# Patient Record
Sex: Male | Born: 1995 | Race: White | Hispanic: No | Marital: Single | Smoking: Current every day smoker
Health system: Southern US, Community
[De-identification: ages and names within clinical notes are randomized; demographics above are authoritative.]

## PROBLEM LIST (undated history)

## (undated) DIAGNOSIS — Z202 Contact with and (suspected) exposure to infections with a predominantly sexual mode of transmission: Secondary | ICD-10-CM

## (undated) HISTORY — PX: ADENOIDECTOMY: SUR15

## (undated) HISTORY — PX: OTHER SURGICAL HISTORY: SHX169

## (undated) HISTORY — PX: APPENDECTOMY: SHX54

---
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
## 2014-05-01 ENCOUNTER — Encounter: Payer: Self-pay | Admitting: *Deleted

## 2014-05-01 ENCOUNTER — Emergency Department (INDEPENDENT_AMBULATORY_CARE_PROVIDER_SITE_OTHER)
Admission: EM | Admit: 2014-05-01 | Discharge: 2014-05-01 | Disposition: A | Discharge status: Home / Self Care | Payer: BLUE CROSS/BLUE SHIELD | Source: Home / Self Care | Attending: Family Medicine | Admitting: Family Medicine

## 2014-05-01 DIAGNOSIS — Z202 Contact with and (suspected) exposure to infections with a predominantly sexual mode of transmission: Secondary | ICD-10-CM | POA: Diagnosis not present

## 2014-05-01 DIAGNOSIS — R112 Nausea with vomiting, unspecified: Secondary | ICD-10-CM

## 2014-05-01 NOTE — ED Provider Notes (Signed)
CSN: [id number]     Arrival date & time 05/01/14  1454 History   First MD Initiated Contact with Patient 05/01/14 1537     Chief Complaint  Patient presents with  . std testing   . Emesis      HPI Comments: Patient presents with two complaints: 1)  Yesterday morning at 7am while at work he developed nausea/vomiting without diarrhea.  No abdominal pain, and no fevers, chills, and sweats.  He states that he had had mild URI symptoms for about a week prior.  His symptoms have now resolved and he feels better. 2)  He states that he would like STD screening.  He is totally assymptomatic, but a recent sexual partner may have HSV.  Patient is a 19 y.o. male presenting with vomiting. The history is provided by the patient.  Emesis Severity:  Mild Duration:  1 day Timing:  Intermittent Quality:  Stomach contents Able to tolerate:  Liquids and solids Progression:  Resolved Chronicity:  New Recent urination:  Normal Relieved by:  Nothing Worsened by:  Nothing tried Ineffective treatments:  None tried Associated symptoms: cough and URI   Associated symptoms: no abdominal pain, no arthralgias, no chills, no diarrhea, no fever, no headaches, no myalgias and no sore throat     History reviewed. No pertinent past medical history. Past Surgical History  Procedure Laterality Date  . Appendectomy    . Adenoidectomy     Family History  Problem Relation Age of Onset  . Thyroid disease Mother   . Cancer Mother     kidney  . Thalassemia Mother   . Thalassemia Sister    History  Substance Use Topics  . Smoking status: Current Every Day Smoker  . Smokeless tobacco: Never Used  . Alcohol Use: Yes    Review of Systems  Constitutional: Negative for chills.  HENT: Negative for sore throat.   Gastrointestinal: Positive for vomiting. Negative for abdominal pain and diarrhea.  Musculoskeletal: Negative for myalgias and arthralgias.  Neurological: Negative for headaches.  All other systems  reviewed and are negative.  Nursing notes and Vital Signs reviewed. Appearance:  Patient appears stated age, and in no acute distress Eyes:  Pupils are equal, round, and reactive to light and accomodation.  Extraocular movement is intact.  Conjunctivae are not inflamed  Ears:  Canals normal.  Tympanic membranes normal.  Nose:  Normal turbinates.  No sinus tenderness.     Pharynx:  Normal; moist mucous membranes  Neck:  Supple.  No adenopathy  Lungs:  Clear to auscultation.  Breath sounds are equal.  Heart:  Regular rate and rhythm without murmurs, rubs, or gallops.  Abdomen:  Nontender without masses or hepatosplenomegaly.  Bowel sounds are present.  No CVA or flank tenderness.  Extremities:  No edema.  No calf tenderness Skin:  No rash present.   Allergies  Review of patient's allergies indicates no known allergies.  Home Medications   Prior to Admission medications   Not on File   BP 126/74 mmHg  Pulse 54  Temp(Src) 97.8 F (36.6 C) (Oral)  Resp 14  Ht  (1.854 m)  Wt 151 lb (68.493 kg)  BMI 19.93 kg/m2  SpO2 99% Physical Exam  ED Course  Procedures  None    Labs Reviewed  GC/CHLAMYDIA PROBE AMP, URINE  HSV(HERPES SIMPLEX VRS) I + II AB-IGG  HSV(HERPES SIMPLEX VRS) I + II AB-IGM  RPR  HIV ANTIBODY (ROUTINE TESTING)      MDM  1. Possible exposure to STD   2. Nausea and vomiting, vomiting of unspecified type; suspect resolving viral gastroenteritis      Advance diet to a BRAT diet (Bananas, Rice, Applesauce, Toast) when nausea and vomiting resolved.  Then gradually advance to a regular diet as tolerated.  May continue Phenergan as needed for nausea. GC/chlamydia, HSV antibodies, RPR, HIV antibodies pending   Lattie Haw, MD 05/07/14 [phone]

## 2014-05-01 NOTE — Discharge Instructions (Signed)
Advance diet to a BRAT diet (Bananas, Rice, Applesauce, Toast) when nausea and vomiting resolved.  Then gradually advance to a regular diet as tolerated.  May continue Phenergan as needed for nausea.

## 2014-05-01 NOTE — ED Notes (Addendum)
Daniel Boyd c/o vomiting x yesterday and this AM.Denies abdominal pain or fever. He would also like to be screened for STDs. He is asymptomatic

## 2014-05-02 LAB — HIV ANTIBODY (ROUTINE TESTING W REFLEX): HIV 1&2 Ab, 4th Generation: NONREACTIVE

## 2014-05-02 LAB — RPR

## 2014-05-03 LAB — HSV(HERPES SIMPLEX VRS) I + II AB-IGG
HSV 1 Glycoprotein G Ab, IgG: <0.1 IV
HSV 2 Glycoprotein G Ab, IgG: <0.1 IV

## 2014-05-03 LAB — HSV(HERPES SIMPLEX VRS) I + II AB-IGM: Herpes Simplex Vrs I&II-IgM Ab (EIA): 0.55 INDEX

## 2014-05-05 ENCOUNTER — Telehealth: Payer: Self-pay | Admitting: *Deleted

## 2014-05-05 LAB — GC/CHLAMYDIA PROBE AMP, URINE
Chlamydia, Swab/Urine, PCR: NEGATIVE
GC PROBE AMP, URINE: NEGATIVE

## 2014-06-20 ENCOUNTER — Emergency Department (INDEPENDENT_AMBULATORY_CARE_PROVIDER_SITE_OTHER)
Admission: EM | Admit: 2014-06-20 | Discharge: 2014-06-20 | Disposition: A | Discharge status: Home / Self Care | Payer: BLUE CROSS/BLUE SHIELD | Source: Home / Self Care | Attending: Emergency Medicine | Admitting: Emergency Medicine

## 2014-06-20 ENCOUNTER — Encounter: Payer: Self-pay | Admitting: Emergency Medicine

## 2014-06-20 DIAGNOSIS — B86 Scabies: Secondary | ICD-10-CM

## 2014-06-20 MED ORDER — PERMETHRIN 5 % EX CREA: TOPICAL_CREAM | CUTANEOUS | Status: DC

## 2014-06-20 NOTE — ED Notes (Signed)
Pt c/o itchy rash on entire bosy x3 weeks. States it continues to spread and he noticed on his penis yesterday. Denies pain or leakage.

## 2014-06-20 NOTE — Discharge Instructions (Signed)

## 2014-06-21 NOTE — ED Provider Notes (Signed)
CSN: [id number]     Arrival date & time 06/20/14  1731 History   First MD Initiated Contact with Patient 06/20/14 1811     Chief Complaint  Patient presents with  . Rash     (Consider location/radiation/quality/duration/timing/severity/associated sxs/prior Treatment) Patient is a 19 y.o. male presenting with rash. The history is provided by the patient. No language interpreter was used.  Rash Location:  Full body Quality: itchiness   Severity:  Moderate Onset quality:  Gradual Duration:  3 weeks Timing:  Constant Progression:  Worsening Chronicity:  New Context: not exposure to similar rash   Relieved by:  Nothing Worsened by:  Nothing tried Ineffective treatments:  None tried Pt reports he has a rash full body,  Rash started after wearing gloves at work (someone elses)  History reviewed. No pertinent past medical history. Past Surgical History  Procedure Laterality Date  . Appendectomy    . Adenoidectomy     Family History  Problem Relation Age of Onset  . Thyroid disease Mother   . Cancer Mother     kidney  . Thalassemia Mother   . Thalassemia Sister    History  Substance Use Topics  . Smoking status: Current Every Day Smoker  . Smokeless tobacco: Never Used  . Alcohol Use: Yes    Review of Systems  Skin: Positive for rash.  All other systems reviewed and are negative.     Allergies  Review of patient's allergies indicates no known allergies.  Home Medications   Prior to Admission medications   Medication Sig Start Date End Date Taking? Authorizing Provider  permethrin (ELIMITE) 5 % cream Apply to affected area once 06/20/14   Lonia SkinnerLeslie K Krysteena Stalker, PA-C   BP 120/77 mmHg  Pulse 80  Temp(Src) 98.2 F (36.8 C) (Oral)  SpO2 97% Physical Exam  Constitutional: He is oriented to person, place, and time. He appears well-developed and well-nourished.  HENT:  Head: Normocephalic.  Eyes: EOM are normal.  Neck: Normal range of motion.  Pulmonary/Chest: Effort  normal.  Abdominal: He exhibits no distension.  Musculoskeletal: Normal range of motion.  Neurological: He is alert and oriented to person, place, and time.  Skin: There is erythema.  Burrows between fingers,  Arms chest abdomen adn genital area  Psychiatric: He has a normal mood and affect.  Nursing note and vitals reviewed.   ED Course  Procedures (including critical care time) Labs Review Labs Reviewed - No data to display  Imaging Review No results found.   EKG Interpretation None      MDM   Final diagnoses:  Scabies    Elemite Information of scabies treatment AVS    Elson AreasLeslie K Naz Denunzio, PA-C 06/21/14 1414

## 2014-09-30 ENCOUNTER — Emergency Department (INDEPENDENT_AMBULATORY_CARE_PROVIDER_SITE_OTHER)
Admission: EM | Admit: 2014-09-30 | Discharge: 2014-09-30 | Disposition: A | Discharge status: Home / Self Care | Payer: BLUE CROSS/BLUE SHIELD | Source: Home / Self Care | Attending: Emergency Medicine | Admitting: Emergency Medicine

## 2014-09-30 ENCOUNTER — Encounter: Payer: Self-pay | Admitting: *Deleted

## 2014-09-30 DIAGNOSIS — J020 Streptococcal pharyngitis: Secondary | ICD-10-CM

## 2014-09-30 LAB — POCT RAPID STREP A (OFFICE): RAPID STREP A SCREEN: NEGATIVE

## 2014-09-30 NOTE — ED Notes (Signed)
Sore throat, runny nose and congestion x 4 days. Afebrile.

## 2014-09-30 NOTE — ED Provider Notes (Signed)
CSN: [id number]     Arrival date & time 09/30/14  1642 History   First MD Initiated Contact with Patient 09/30/14 1708     Chief Complaint  Patient presents with  . Sore Throat   (Consider location/radiation/quality/duration/timing/severity/associated sxs/prior Treatment) HPI Casimiro NeedleMichael is a 19 y.o. male who complains of onset of cold symptoms for 4 days.  The symptoms are constant and mild-moderate in severity. Sometimes gets a sore throat because he occasionally sleeps with his mouth open. + sore throat No cough No pleuritic pain No wheezing No nasal congestion No post-nasal drainage No sinus pain/pressure No chest congestion No itchy/red eyes No earache No hemoptysis No SOB No chills/sweats No fever No nausea No vomiting No abdominal pain No diarrhea No skin rashes No fatigue No myalgias No headache     History reviewed. No pertinent past medical history. Past Surgical History  Procedure Laterality Date  . Appendectomy    . Adenoidectomy     Family History  Problem Relation Age of Onset  . Thyroid disease Mother   . Cancer Mother     kidney  . Thalassemia Mother   . Thalassemia Sister    History  Substance Use Topics  . Smoking status: Current Every Day Smoker  . Smokeless tobacco: Never Used  . Alcohol Use: Yes    Review of Systems  All other systems reviewed and are negative.   Allergies  Review of patient's allergies indicates no known allergies.  Home Medications   Prior to Admission medications   Medication Sig Start Date End Date Taking? Authorizing Provider  permethrin (ELIMITE) 5 % cream Apply to affected area once 06/20/14   Lonia SkinnerLeslie K Sofia, PA-C   BP 128/65 mmHg  Pulse 55  Temp(Src) 98.3 F (36.8 C) (Oral)  Resp 16  Wt 154 lb (69.854 kg)  SpO2 99% Physical Exam  Constitutional: He is oriented to person, place, and time. Vital signs are normal. He appears well-developed and well-nourished.  Non-toxic appearance. He does not appear ill.   HENT:  Head: Normocephalic and atraumatic.  Right Ear: Tympanic membrane, external ear and ear canal normal.  Left Ear: Tympanic membrane, external ear and ear canal normal.  Nose: Mucosal edema and rhinorrhea present.  Mouth/Throat: Posterior oropharyngeal erythema present. No oropharyngeal exudate or posterior oropharyngeal edema.  Eyes: No scleral icterus.  Neck: Neck supple.  Cardiovascular: Normal rate, regular rhythm and normal heart sounds.   Pulmonary/Chest: Effort normal and breath sounds normal. No respiratory distress.  Neurological: He is alert and oriented to person, place, and time.  Skin: Skin is warm and dry.  Psychiatric: He has a normal mood and affect. His speech is normal.  Nursing note and vitals reviewed.   ED Course  Procedures (including critical care time) Labs Review Labs Reviewed - No data to display  Imaging Review No results found.   MDM   1. Streptococcal sore throat    1)  Rapid strep neg, culture pending.  Will call patient with results.  No antibiotics given today. 2)  Use nasal saline solution (over the counter) at least 3 times a day. 3)  Use over the counter decongestants like Zyrtec-D every 12 hours as needed to help with congestion.  If you have hypertension, do not take medicines with sudafed.  4)  Can take tylenol every 6 hours or motrin every 8 hours for pain or fever. 5)  Follow up with your primary doctor if no improvement in 5-7 days, sooner if increasing pain, fever,  or new symptoms.     Marlaine Hind, MD 09/30/14 [phone]

## 2014-10-01 LAB — STREP A DNA PROBE: GASP: NEGATIVE

## 2014-10-02 ENCOUNTER — Telehealth: Payer: Self-pay | Admitting: *Deleted

## 2015-01-02 ENCOUNTER — Encounter: Payer: Self-pay | Admitting: Emergency Medicine

## 2015-01-02 ENCOUNTER — Emergency Department
Admission: EM | Admit: 2015-01-02 | Discharge: 2015-01-02 | Disposition: A | Discharge status: Home / Self Care | Payer: PRIVATE HEALTH INSURANCE | Source: Home / Self Care | Attending: Family Medicine | Admitting: Family Medicine

## 2015-01-02 DIAGNOSIS — R300 Dysuria: Secondary | ICD-10-CM | POA: Diagnosis not present

## 2015-01-02 DIAGNOSIS — R369 Urethral discharge, unspecified: Secondary | ICD-10-CM

## 2015-01-02 DIAGNOSIS — Z8619 Personal history of other infectious and parasitic diseases: Secondary | ICD-10-CM

## 2015-01-02 DIAGNOSIS — Z711 Person with feared health complaint in whom no diagnosis is made: Secondary | ICD-10-CM | POA: Diagnosis not present

## 2015-01-02 HISTORY — DX: Contact with and (suspected) exposure to infections with a predominantly sexual mode of transmission: Z20.2

## 2015-01-02 LAB — POCT URINALYSIS DIP (MANUAL ENTRY)
Bilirubin, UA: NEGATIVE
Blood, UA: NEGATIVE
Glucose, UA: NEGATIVE
Ketones, POC UA: NEGATIVE
Leukocytes, UA: NEGATIVE
Nitrite, UA: NEGATIVE
Protein Ur, POC: 30 — AB
Spec Grav, UA: 1.02 (ref 1.005–1.03)
Urobilinogen, UA: 4 (ref 0–1)
pH, UA: 7 (ref 5–8)

## 2015-01-02 MED ORDER — AZITHROMYCIN 250 MG PO TABS: 1000.0000 mg | ORAL_TABLET | Freq: Every day | ORAL | Status: DC

## 2015-01-02 MED ORDER — CEFTRIAXONE SODIUM 250 MG IJ SOLR
250.0000 mg | Freq: Once | INTRAMUSCULAR | Status: AC
  Administered 2015-01-02: 250 mg via INTRAMUSCULAR

## 2015-01-02 NOTE — Discharge Instructions (Signed)
Refrain from sexual intercourse for 7 days. Be sure to have all partners tested and treated for STDs.  This may be performed by your primary care provider or the health department.  Practice safe sex by always using condoms.   Please take antibiotics as prescribed and be sure to complete entire course even if you start to feel better to ensure infection does not come back.

## 2015-01-02 NOTE — ED Notes (Signed)
Reports 3-4 days of dysuria and some penile discharge. Has history of chlamydia and feels this is similar symptomology.

## 2015-01-02 NOTE — ED Provider Notes (Signed)
CSN: [id number]     Arrival date & time 01/02/15  1138 History   First MD Initiated Contact with Patient 01/02/15 1139     Chief Complaint  Patient presents with  . Dysuria  . Penile Discharge   (Consider location/radiation/quality/duration/timing/severity/associated sxs/prior Treatment) HPI Pt is a 19yo male presenting to [facility] with concern for STD. Pt c/o 3-4 day hx of dysuria and small amount of white penile discharge. Pt states it feels similar to symptoms he had when he had chlamydia when he was 19yo. Denies known exposure but does report having a new sexual partner and having unprotected intercourse.  Denies fever, chills, n/v/d. Denies abdominal or back pain. Denies rashes or lesions. No hx of kidney stones.  He was tested for HIV and syphilis in Jan 2016, tested negative. He does not want to be tested for these again at this time.  Past Medical History  Diagnosis Date  . Chlamydia contact, treated    Past Surgical History  Procedure Laterality Date  . Appendectomy    . Adenoidectomy    . Adnoidectomy     Family History  Problem Relation Age of Onset  . Thyroid disease Mother   . Cancer Mother     kidney  . Thalassemia Mother   . Thalassemia Sister    Social History  Substance Use Topics  . Smoking status: Current Every Day Smoker -- 1.00 packs/day  . Smokeless tobacco: Never Used  . Alcohol Use: Yes    Review of Systems  Constitutional: Negative for fever and chills.  Gastrointestinal: Negative for nausea, vomiting, abdominal pain and diarrhea.  Genitourinary: Positive for dysuria, urgency, frequency, decreased urine volume and discharge. Negative for hematuria, flank pain, penile swelling, scrotal swelling, genital sores, penile pain and testicular pain.  Musculoskeletal: Negative for myalgias and back pain.  Skin: Negative for rash.    Allergies  Review of patient's allergies indicates no known allergies.  Home Medications   Prior to Admission medications    Medication Sig Start Date End Date Taking? Authorizing Provider  azithromycin (ZITHROMAX) 250 MG tablet Take 4 tablets (1,000 mg total) by mouth daily. Take all 4 tabs at same time for complete treatment 01/02/15   Junius Finner, PA-C  permethrin (ELIMITE) 5 % cream Apply to affected area once 06/20/14   Elson Areas, PA-C   Meds Ordered and Administered this Visit   Medications  cefTRIAXone (ROCEPHIN) injection 250 mg (250 mg Intramuscular Given 01/02/15 1215)    BP 105/65 mmHg  Pulse 70  Temp(Src) 98 F (36.7 C) (Oral)  Resp 16  Ht 6\' 1"  (1.854 m)  Wt 144 lb (65.318 kg)  BMI 19.00 kg/m2  SpO2 98% No data found.   Physical Exam  Constitutional: He is oriented to person, place, and time. He appears well-developed and well-nourished.  HENT:  Head: Normocephalic and atraumatic.  Eyes: EOM are normal.  Neck: Normal range of motion.  Cardiovascular: Normal rate, regular rhythm and normal heart sounds.   Pulmonary/Chest: Effort normal. No respiratory distress. He has no wheezes.  Abdominal: Soft. There is no tenderness.  Musculoskeletal: Normal range of motion.  Neurological: He is alert and oriented to person, place, and time.  Skin: Skin is warm and dry. No rash noted.  Psychiatric: He has a normal mood and affect. His behavior is normal.  Nursing note and vitals reviewed.   ED Course  Procedures (including critical care time)  Labs Review Labs Reviewed  POCT URINALYSIS DIP (MANUAL ENTRY) - Abnormal;  Notable for the following:    Color, UA light yellow (*)    Clarity, UA cloudy (*)    Protein Ur, POC =30 (*)    All other components within normal limits  GC/CHLAMYDIA PROBE AMP, URINE  POCT UA - MICROSCOPIC ONLY    Imaging Review No results found.   MDM   1. Penile discharge   2. Dysuria   3. Concern about STD in male without diagnosis   4. History of chlamydia     Pt c/o penile discharge and dysuria.  Pt concerned for chlamydia. No systemic symptoms. Tx:  rocephin  IM Rx: azithromycin 1g  Advised pt to refrain from sexual intercourse for 7 days. Be sure to have all partners tested and treated for STDs.  This may be performed by your primary care provider or the health department.  Practice safe sex by always using condoms.  Patient verbalized understanding and agreement with treatment plan.      Junius Finner, PA-C 01/02/15 1243

## 2015-01-05 ENCOUNTER — Telehealth: Payer: Self-pay | Admitting: *Deleted

## 2015-01-05 LAB — GC/CHLAMYDIA PROBE AMP, URINE
Chlamydia, Swab/Urine, PCR: POSITIVE — AB
GC Probe Amp, Urine: NEGATIVE

## 2015-03-29 ENCOUNTER — Encounter: Payer: Self-pay | Admitting: *Deleted

## 2015-03-29 ENCOUNTER — Emergency Department
Admission: EM | Admit: 2015-03-29 | Discharge: 2015-03-29 | Disposition: A | Discharge status: Home / Self Care | Payer: BLUE CROSS/BLUE SHIELD | Source: Home / Self Care | Attending: Family Medicine | Admitting: Family Medicine

## 2015-03-29 DIAGNOSIS — R6889 Other general symptoms and signs: Secondary | ICD-10-CM | POA: Diagnosis not present

## 2015-03-29 LAB — POCT INFLUENZA A/B
Influenza A, POC: NEGATIVE
Influenza B, POC: NEGATIVE

## 2015-03-29 MED ORDER — DM-GUAIFENESIN ER 30-600 MG PO TB12: 1.0000 | ORAL_TABLET | Freq: Two times a day (BID) | ORAL | Status: DC

## 2015-03-29 MED ORDER — BENZONATATE 100 MG PO CAPS: 100.0000 mg | ORAL_CAPSULE | Freq: Three times a day (TID) | ORAL | Status: DC

## 2015-03-29 MED ORDER — SALINE SPRAY 0.65 % NA SOLN: 1.0000 | NASAL | Status: DC | PRN

## 2015-03-29 NOTE — ED Notes (Signed)
Pt c/o HA, nasal congestion, and cough x 1200 today. Denies sore throat or body aches. Reports taking Advil at 1030. Reports temp at work was 99.9 at 1225.

## 2015-03-29 NOTE — Discharge Instructions (Signed)

## 2015-03-29 NOTE — ED Provider Notes (Signed)
CSN: [id number]     Arrival date & time 03/29/15  1428 History   First MD Initiated Contact with Patient 03/29/15 1445     Chief Complaint  Patient presents with  . Headache  . Cough   (Consider location/radiation/quality/duration/timing/severity/associated sxs/prior Treatment) HPI  Pt is a 19yo male presenting to [facility] with c/o sudden onset flu-like symptoms that started around 12PM.  Pt c/o cough, congestion, body aches, fever, and chills.  Temp was 99.9 at work when pt was evaluated by the nurse.  Pt did take 1 Advil earlier this morning for a mild generalized headache. Pt states now the body aches and congestion are most worrisome for him.    Nurse at work advised him she sent another worker home yesterday for a fever. No sick contacts at home. No recent travel. He has not received flu vaccine.   Past Medical History  Diagnosis Date  . Chlamydia contact, treated    Past Surgical History  Procedure Laterality Date  . Appendectomy    . Adenoidectomy    . Adnoidectomy     Family History  Problem Relation Age of Onset  . Thyroid disease Mother   . Cancer Mother     kidney  . Thalassemia Mother   . Thalassemia Sister    Social History  Substance Use Topics  . Smoking status: Current Every Day Smoker -- 0.50 packs/day    Types: Cigarettes  . Smokeless tobacco: Never Used  . Alcohol Use: Yes    Review of Systems  Constitutional: Positive for fever and chills.  HENT: Positive for congestion, rhinorrhea, sinus pressure, sneezing and sore throat. Negative for trouble swallowing and voice change.   Respiratory: Positive for cough. Negative for shortness of breath.   Gastrointestinal: Negative for nausea, vomiting, abdominal pain and diarrhea.  Musculoskeletal: Positive for myalgias and arthralgias.  Neurological: Positive for headaches. Negative for dizziness and light-headedness.    Allergies  Review of patient's allergies indicates no known allergies.  Home Medications    Prior to Admission medications   Medication Sig Start Date End Date Taking? Authorizing Provider  benzonatate (TESSALON) 100 MG capsule Take 1 capsule (100 mg total) by mouth every 8 (eight) hours. 03/29/15   Junius Finner, PA-C  dextromethorphan-guaiFENesin (MUCINEX DM) 30-600 MG 12hr tablet Take 1 tablet by mouth 2 (two) times daily. 03/29/15   Junius Finner, PA-C  sodium chloride (OCEAN) 0.65 % SOLN nasal spray Place 1 spray into both nostrils as needed. 03/29/15   Junius Finner, PA-C   Meds Ordered and Administered this Visit  Medications - No data to display  BP 143/86 mmHg  Pulse 78  Temp(Src) 98.1 F (36.7 C) (Oral)  Resp 16  Ht  (1.854 m)  Wt 149 lb (67.586 kg)  BMI 19.66 kg/m2  SpO2 99% No data found.   Physical Exam  Constitutional: He appears well-developed and well-nourished.  HENT:  Head: Normocephalic and atraumatic.  Right Ear: Hearing, tympanic membrane, external ear and ear canal normal.  Left Ear: Hearing, tympanic membrane, external ear and ear canal normal.  Nose: Mucosal edema present.  Mouth/Throat: Uvula is midline and mucous membranes are normal. Posterior oropharyngeal erythema present. No oropharyngeal exudate, posterior oropharyngeal edema or tonsillar abscesses.  Eyes: Conjunctivae are normal. No scleral icterus.  Neck: Normal range of motion. Neck supple.  Cardiovascular: Normal rate, regular rhythm and normal heart sounds.   Pulmonary/Chest: Effort normal and breath sounds normal. No respiratory distress. He has no wheezes. He has no rales.  He exhibits no tenderness.  Abdominal: Soft. He exhibits no distension and no mass. There is no tenderness. There is no rebound and no guarding.  Musculoskeletal: Normal range of motion.  Neurological: He is alert.  Skin: Skin is warm and dry.  Nursing note and vitals reviewed.   ED Course  Procedures (including critical care time)  Labs Review Labs Reviewed  POCT INFLUENZA A/B    Imaging  Review No results found.   MDM   1. Flu-like symptoms    [facility] flu-like symptoms that started suddenly while at work this afternoon.   Rapid flu: negative  Symptoms likely viral in nature Rx: tessalon, mucinex DM, and saline nasal spray Encouraged fluids and rest.  F/u with PCP in 7-10 days if not improving, sooner if worsening. Patient verbalized understanding and agreement with treatment plan.     Junius Finnerrin O'Malley, PA-C 03/29/15 [phone]

## 2015-05-13 ENCOUNTER — Encounter: Payer: Self-pay | Admitting: *Deleted

## 2015-05-13 ENCOUNTER — Other Ambulatory Visit: Payer: Self-pay | Admitting: Emergency Medicine

## 2015-05-13 ENCOUNTER — Emergency Department
Admission: EM | Admit: 2015-05-13 | Discharge: 2015-05-13 | Disposition: A | Discharge status: Home / Self Care | Payer: BLUE CROSS/BLUE SHIELD | Source: Home / Self Care | Attending: Family Medicine | Admitting: Family Medicine

## 2015-05-13 DIAGNOSIS — Z202 Contact with and (suspected) exposure to infections with a predominantly sexual mode of transmission: Secondary | ICD-10-CM

## 2015-05-13 DIAGNOSIS — K029 Dental caries, unspecified: Secondary | ICD-10-CM | POA: Diagnosis not present

## 2015-05-13 DIAGNOSIS — K0889 Other specified disorders of teeth and supporting structures: Secondary | ICD-10-CM

## 2015-05-13 DIAGNOSIS — R369 Urethral discharge, unspecified: Secondary | ICD-10-CM

## 2015-05-13 MED ORDER — CEFTRIAXONE SODIUM 250 MG IJ SOLR
250.0000 mg | Freq: Once | INTRAMUSCULAR | Status: AC
  Administered 2015-05-13: 250 mg via INTRAMUSCULAR

## 2015-05-13 MED ORDER — DOXYCYCLINE HYCLATE 100 MG PO CAPS: 100.0000 mg | ORAL_CAPSULE | Freq: Two times a day (BID) | ORAL | Status: AC

## 2015-05-13 MED ORDER — MAGIC MOUTHWASH W/LIDOCAINE: 10.0000 mL | Freq: Three times a day (TID) | ORAL | Status: AC | PRN

## 2015-05-13 NOTE — Discharge Instructions (Signed)
You may take acetaminophen and ibuprofen for fever and pain.  Please take antibiotics as prescribed and be sure to complete entire course even if you start to feel better to ensure infection does not come back.  Be sure to call your dentist to schedule a follow up appointment for continued treatment of your teeth infections as you will likely need your wisdom teeth pulled.

## 2015-05-13 NOTE — ED Notes (Signed)
Pt c/o bilateral tooth pain on the bottom x 2 wks with HA. He reports taking Hydrocodone for pain. He request an rx for magic mouthwash. He also reports a hx of Chlamydia.he may have had some penile d/c today or "it could have been semen". Request testing.

## 2015-05-13 NOTE — ED Provider Notes (Signed)
CSN: [id number]     Arrival date & time 05/13/15  1907 History   First MD Initiated Contact with Patient 05/13/15 1916     Chief Complaint  Patient presents with  . Exposure to STD  . Dental Pain   (Consider location/radiation/quality/duration/timing/severity/associated sxs/prior Treatment) HPI Pt is a 20yo male [facility] with 2 separate complaints. Pt states he is initially here due to dental pain from his upper wisdom teeth that are infected.  Pt states he failed a drug screen for a "community group" he is with as he has been taking hydrocodone for pain but states it was not prescribed to him. He was advised to take care of his pain "the appropriate way" and is requesting magic mouthwash.  Pain is aching and throbbing, waxes and wanes, worse with chewing.  Pain is moderate to severe at times. He knows he needs to get them taken out but has not gotten around to it yet and states his dentist was closed today.  Denies fever, chills, n/v/d.  Pt also c/o symptoms concerning for chlamydia. Pt reports hx of several episodes of chlamydia.  Last episode was 2-3 months ago. Symptoms did resolve after treatment but for 2-3 days he has been having penile discharge. Pt is unsure if it is an STD or if it was just semen. He does report hx of unprotected intercourse. Denies abdominal pain, back pain, fever, chills, n/v/d.   Past Medical History  Diagnosis Date  . Chlamydia contact, treated    Past Surgical History  Procedure Laterality Date  . Appendectomy    . Adenoidectomy    . Adnoidectomy     Family History  Problem Relation Age of Onset  . Thyroid disease Mother   . Cancer Mother     kidney  . Thalassemia Mother   . Thalassemia Sister    Social History  Substance Use Topics  . Smoking status: Current Every Day Smoker -- 0.50 packs/day    Types: Cigarettes  . Smokeless tobacco: Never Used  . Alcohol Use: Yes    Review of Systems  Constitutional: Negative for fever and chills.   HENT: Positive for dental problem. Negative for congestion, sore throat, trouble swallowing and voice change.   Respiratory: Negative for cough and shortness of breath.   Gastrointestinal: Negative for nausea, vomiting, abdominal pain and diarrhea.  Genitourinary: Positive for discharge. Negative for dysuria, urgency, flank pain, decreased urine volume, penile swelling, scrotal swelling, penile pain and testicular pain.    Allergies  Review of patient's allergies indicates no known allergies.  Home Medications   Prior to Admission medications   Medication Sig Start Date End Date Taking? Authorizing Provider  HYDROcodone-acetaminophen (NORCO/VICODIN) 5-325 MG tablet Take 1 tablet by mouth every 6 (six) hours as needed for moderate pain.   Yes Historical Provider, MD  doxycycline (VIBRAMYCIN) 100 MG capsule Take 1 capsule (100 mg total) by mouth 2 (two) times daily. One po bid x 7 days 05/13/15   Junius FinnerErin O'Malley, PA-C  magic mouthwash w/lidocaine SOLN Take 10 mLs by mouth 3 (three) times daily as needed for mouth pain. 05/13/15   Junius FinnerErin O'Malley, PA-C   Meds Ordered and Administered this Visit   Medications  cefTRIAXone (ROCEPHIN) injection 250 mg (250 mg Intramuscular Given 05/13/15 1932)    BP 131/79 mmHg  Pulse 85  Temp(Src) 98 F (36.7 C) (Oral)  Resp 16  Ht 6\' 1"  (1.854 m)  Wt 153 lb (69.4 kg)  BMI 20.19 kg/m2  SpO2  97% No data found.   Physical Exam  Constitutional: He appears well-developed and well-nourished.  HENT:  Head: Normocephalic and atraumatic.  Eyes: Conjunctivae are normal. No scleral icterus.  Neck: Normal range of motion. Neck supple.  Cardiovascular: Normal rate, regular rhythm and normal heart sounds.   Pulmonary/Chest: Effort normal and breath sounds normal. No respiratory distress. He has no wheezes. He has no rales. He exhibits no tenderness.  Abdominal: Soft. He exhibits no distension and no mass. There is no tenderness. There is no rebound and no  guarding.  Musculoskeletal: Normal range of motion.  Neurological: He is alert.  Skin: Skin is warm and dry.  Nursing note and vitals reviewed.   ED Course  Procedures (including critical care time)  Labs Review Labs Reviewed  GC/CHLAMYDIA PROBE AMP, URINE    Imaging Review No results found.    MDM   1. STD exposure   2. Dental decay   3. Tooth pain   4. Penile discharge    Pt c/o tooth pain. Exam c/w dental decay but no gingival abscess requiring I&D at this time.   Pt also concerned for chlamydia due to possible penile discharge and unprotected intercourse.  Rx: doxycycline and magic mouthwash with lidocaine  Encouraged to f/u with Dentist for further evaluation and removal of wisdom teeth.  Also f/u with PCP for recurrent exposures for STDs. Discussed safe sex practices. Encouraged to use condoms and make sure all partners are tested and treated.    Junius Finner, PA-C 05/13/15 2018

## 2015-05-14 ENCOUNTER — Other Ambulatory Visit: Payer: Self-pay | Admitting: Emergency Medicine

## 2015-05-14 LAB — GC/CHLAMYDIA PROBE AMP
CT Probe RNA: NOT DETECTED
GC Probe RNA: NOT DETECTED

## 2015-05-15 ENCOUNTER — Telehealth: Payer: Self-pay

## 2015-05-18 NOTE — Progress Notes (Signed)
Order(s) created erroneously. Erroneous order ID: [id number]  Order moved by: Earna Coder  Order move date/time: 05/18/2015 4:35 PM  Source Patient: [id number]  Source Contact: 05/14/2015  Destination Patient: [id number]  Destination Contact: 07/15/2012  Erroneous order ID: [id number]  Order moved by: Earna Coder  Order move date/time: 05/18/2015 4:35 PM  Source Patient: [id number]  Source Contact: 05/14/2015  Destination Patient: [id number]  Destination Contact: 07/15/2012

## 2015-06-04 ENCOUNTER — Encounter: Payer: Self-pay | Admitting: Emergency Medicine

## 2015-06-04 ENCOUNTER — Emergency Department
Admission: EM | Admit: 2015-06-04 | Discharge: 2015-06-04 | Disposition: A | Discharge status: Home / Self Care | Payer: BLUE CROSS/BLUE SHIELD | Source: Home / Self Care | Attending: Family Medicine | Admitting: Family Medicine

## 2015-06-04 DIAGNOSIS — G43809 Other migraine, not intractable, without status migrainosus: Secondary | ICD-10-CM | POA: Diagnosis not present

## 2015-06-04 MED ORDER — IBUPROFEN 600 MG PO TABS
600.0000 mg | ORAL_TABLET | Freq: Once | ORAL | Status: AC
  Administered 2015-06-04: 600 mg via ORAL

## 2015-06-04 NOTE — ED Notes (Signed)
Reports headache and vomiting x 1 today; is here for note from work so he can go home; thinks he could sleep it off; has not tried OTC but is willing.

## 2015-06-04 NOTE — ED Provider Notes (Signed)
CSN: [id number]     Arrival date & time 06/04/15  1321 History   First MD Initiated Contact with Patient 06/04/15 1547     Chief Complaint  Patient presents with  . Headache  . Emesis      HPI Comments: Patient developed a migraine headache today while at work, and had an episode of vomiting, resolved.  He normally has a migraine about every two months, resolved by sleep.  His present headache is typical.  Patient is a 20 y.o. male presenting with migraines. The history is provided by the patient.  Migraine This is a recurrent problem. The current episode started 3 to 5 hours ago. The problem occurs constantly. The problem has been gradually improving. Exacerbated by: movement and light. Nothing relieves the symptoms. He has tried nothing for the symptoms.    Past Medical History  Diagnosis Date  . Chlamydia contact, treated    Past Surgical History  Procedure Laterality Date  . Appendectomy    . Adenoidectomy    . Adnoidectomy     Family History  Problem Relation Age of Onset  . Thyroid disease Mother   . Cancer Mother     kidney  . Thalassemia Mother   . Thalassemia Sister    Social History  Substance Use Topics  . Smoking status: Current Every Day Smoker -- 0.50 packs/day    Types: Cigarettes  . Smokeless tobacco: Never Used  . Alcohol Use: Yes    Review of Systems  All other systems reviewed and are negative.   Allergies  Review of patient's allergies indicates no known allergies.  Home Medications   Prior to Admission medications   Medication Sig Start Date End Date Taking? Authorizing Provider  doxycycline (VIBRAMYCIN) 100 MG capsule Take 1 capsule (100 mg total) by mouth 2 (two) times daily. One po bid x 7 days 05/13/15   Junius Finner, PA-C  HYDROcodone-acetaminophen (NORCO/VICODIN) 5-325 MG tablet Take 1 tablet by mouth every 6 (six) hours as needed for moderate pain.    Historical Provider, MD  magic mouthwash w/lidocaine SOLN Take 10 mLs by mouth 3  (three) times daily as needed for mouth pain. 05/13/15   Junius Finner, PA-C   Meds Ordered and Administered this Visit   Medications  ibuprofen (ADVIL,MOTRIN) tablet 600 mg (600 mg Oral Given 06/04/15 1542)    BP 97/64 mmHg  Pulse 73  Temp(Src) 97.5 F (36.4 C) (Oral)  Resp 16  Ht  (1.854 m)  Wt 150 lb (68.04 kg)  BMI 19.79 kg/m2  SpO2 99% No data found.   Physical Exam Nursing notes and Vital Signs reviewed. Appearance:  Patient appears stated age, and in no acute distress Eyes:  Pupils are equal, round, and reactive to light and accomodation.  Extraocular movement is intact.  Conjunctivae are not inflamed.  Fundi benign.  Minimal photophobia Ears:  Canals normal.  Tympanic membranes normal.  Nose:  Mildly congested turbinates.  No sinus tenderness.   Pharynx:  Normal Neck:  Supple.  No adenopathy Lungs:  Clear to auscultation.  Breath sounds are equal.  Moving air well. Heart:  Regular rate and rhythm without murmurs, rubs, or gallops.  Abdomen:  Nontender without masses or hepatosplenomegaly.  Bowel sounds are present.  No CVA or flank tenderness.  Extremities:  No edema.  Skin:  No rash present.   Neurologic:  Cranial nerves 2 through 12 are normal.  Patellar, achilles, and elbow reflexes are normal.  Cerebellar function is intact (finger-to-nose  and rapid alternating hand movement).  Gait and station are normal.     ED Course  Procedures    MDM   1. Other migraine without status migrainosus, not intractable    Administered Ibuprofen  po.  Remain at home today. Followup with Family Doctor if not improving    Lattie Haw, MD 06/08/15 2013

## 2015-06-04 NOTE — Discharge Instructions (Signed)
